# Patient Record
Sex: Female | Born: 1965 | Race: White | Hispanic: No | Marital: Married | Smoking: Never smoker
Health system: Southern US, Community
[De-identification: ages and names within clinical notes are randomized; demographics above are authoritative.]

---

## 1998-04-20 ENCOUNTER — Inpatient Hospital Stay (HOSPITAL_COMMUNITY): Admission: AD | Admit: 1998-04-20 | Discharge: 1998-04-20 | Payer: Self-pay | Admitting: Obstetrics and Gynecology

## 1998-04-21 ENCOUNTER — Inpatient Hospital Stay (HOSPITAL_COMMUNITY): Admission: AD | Admit: 1998-04-21 | Discharge: 1998-04-21 | Payer: Self-pay | Admitting: Obstetrics and Gynecology

## 1998-04-24 ENCOUNTER — Observation Stay (HOSPITAL_COMMUNITY): Admission: AD | Admit: 1998-04-24 | Discharge: 1998-04-25 | Payer: Self-pay | Admitting: Obstetrics and Gynecology

## 1998-05-28 ENCOUNTER — Inpatient Hospital Stay (HOSPITAL_COMMUNITY): Admission: AD | Admit: 1998-05-28 | Discharge: 1998-05-31 | Payer: Self-pay | Admitting: Obstetrics and Gynecology

## 1998-05-30 ENCOUNTER — Encounter: Payer: Self-pay | Admitting: Obstetrics and Gynecology

## 1998-06-01 ENCOUNTER — Inpatient Hospital Stay (HOSPITAL_COMMUNITY): Admission: AD | Admit: 1998-06-01 | Discharge: 1998-06-05 | Payer: Self-pay | Admitting: Obstetrics and Gynecology

## 1998-06-01 ENCOUNTER — Encounter: Payer: Self-pay | Admitting: Obstetrics and Gynecology

## 1998-06-03 ENCOUNTER — Encounter: Payer: Self-pay | Admitting: Obstetrics and Gynecology

## 1998-06-05 ENCOUNTER — Encounter (HOSPITAL_COMMUNITY): Admission: RE | Admit: 1998-06-05 | Discharge: 1998-09-03 | Payer: Self-pay | Admitting: Obstetrics and Gynecology

## 1999-08-20 ENCOUNTER — Other Ambulatory Visit: Admission: RE | Admit: 1999-08-20 | Discharge: 1999-08-20 | Payer: Self-pay | Admitting: Gynecology

## 2002-04-19 ENCOUNTER — Other Ambulatory Visit: Admission: RE | Admit: 2002-04-19 | Discharge: 2002-04-19 | Payer: Self-pay | Admitting: Gynecology

## 2004-01-23 ENCOUNTER — Inpatient Hospital Stay (HOSPITAL_COMMUNITY): Admission: EM | Admit: 2004-01-23 | Discharge: 2004-01-27 | Payer: Self-pay | Admitting: Orthopedic Surgery

## 2004-01-23 ENCOUNTER — Encounter (INDEPENDENT_AMBULATORY_CARE_PROVIDER_SITE_OTHER): Payer: Self-pay | Admitting: Specialist

## 2007-02-26 ENCOUNTER — Emergency Department (HOSPITAL_COMMUNITY): Admission: EM | Admit: 2007-02-26 | Discharge: 2007-02-26 | Payer: Self-pay | Admitting: Emergency Medicine

## 2009-07-11 ENCOUNTER — Emergency Department (HOSPITAL_COMMUNITY): Admission: EM | Admit: 2009-07-11 | Discharge: 2009-07-11 | Payer: Self-pay | Admitting: Emergency Medicine

## 2010-11-23 NOTE — Op Note (Signed)
NAME:  Nancy Chaney, CHIASSON                     ACCOUNT NO.:  [id number]   MEDICAL RECORD NO.:  [id number]                   PATIENT TYPE:  INP   LOCATION:  0472                                 FACILITY:  [facility]   PHYSICIAN:  Georges Lynch. Darrelyn Hillock, M.D.             DATE OF BIRTH:  1965-09-11   DATE OF PROCEDURE:  01/23/2004  DATE OF DISCHARGE:                                 OPERATIVE REPORT   SURGEON:  Georges Lynch. Darrelyn Hillock, M.D.   ASSISTANT:  Ebbie Ridge. Paitsel, P.A.   PREOPERATIVE DIAGNOSIS:  Basilar neck fracture of the left hip.   POSTOPERATIVE DIAGNOSIS:  Basilar neck fracture of the left hip.   OPERATION:  Open reduction and internal fixation of a basilar neck fracture  of the left hip utilizing an 80 mm length compression screw with a four-hole  Barrow plate device with four screws, and it was a 135-degree angle Barrow  plate device.   PROCEDURE:  Under general anesthesia, the patient on the fracture table, I  first did a closed reduction.  The C-arm was brought in.  We did a sterile  prepping and draping of the left hip.  At this time, a lateral incision was  made over the left lateral aspect of the left hip.  Bleeders were identified  and cauterized.  The incision was carried down to the lateral femoral  cortex.  Retractors were inserted.  Bleeders were identified and cauterized.  I then made a drill hole in the lateral femoral cortex and inserted a guide  pin at a 135-degree angle.  We checked the guide pin out on the AP and  lateral.  We had an anatomical position.  Following this, we then measured  the guide pin to be 80 mm in length and then went on and made our initial  drill hole with our cortical reamer.  We tapped the femoral neck and then  inserted our Barrow plate and compression screw device all in one.  We then  affixed the plate with four screws to the lateral femoral shaft.  X-rays, AP  and lateral, showed excellent position of the Barrow plate device and the  compression screw.  We thoroughly irrigated the area out.  Note, we did  release the traction before we did the compression.  We utilized a small  compression screw to further compress the fracture.  We then removed the  small compression screw.  We thoroughly irrigated out the area, had good  hemostasis.  We inserted some Thrombin-soaked Gelfoam and closed the wound  in layers in the usual fashion.  Skin was closed with metal staples.  A  sterile NeoSporin dressing was applied.  The patient left the operating room  in satisfactory condition.  She had 1 gram of IV Ancef preoperatively.  She  will be maintained on Ancef postoperatively as well as Coumadin and heparin  protocol.  Ronald A. Darrelyn Hillock, M.D.    RAG/MEDQ  D:  01/23/2004  T:  01/24/2004  Job:  [id number]

## 2010-11-23 NOTE — H&P (Signed)
NAME:  Nancy Chaney, Nancy Chaney                     ACCOUNT NO.:  [id number]   MEDICAL RECORD NO.:  [id number]                   PATIENT TYPE:  INP   LOCATION:  0472                                 FACILITY:  [facility]   PHYSICIAN:  Georges Lynch. Darrelyn Hillock, M.D.             DATE OF BIRTH:  1966/05/14   DATE OF ADMISSION:  01/23/2004  DATE OF DISCHARGE:                                HISTORY & PHYSICAL   HISTORY:  Left hip pain.  The patient was seen in the office this afternoon  complaining of left hip pain.  She was seen in the office last week for  strain of her left hip flexor.  The patient was working out at [employer], she  was kneeling down at her locker when she lost her balance and fell back.  She felt a crack in her hip and at that time it made it very difficult for  her to weightbear and she was brought into our office.  She was found to  have a fracture of her left hip basilar neck and was admitted to the  hospital for ORIF, left hip.   PAST MEDICAL HISTORY:  Negative.   DRUG ALLERGIES:  NONE KNOWN.   CURRENT MEDICATIONS:  Celebrex 200 mg daily.   REVIEW OF SYSTEMS:  GENERAL:  She denies weight change, fever, chills or  fatigue.  HEENT:  Denies headaches, visual changes or sore throat.  CARDIOVASCULAR:  Denies chest pain, palpitations, shortness of breath or  orthopnea.  PULMONARY:  Denies dyspnea, wheezing, cough sputum production or  hemoptysis.  GI:  Denies dysphagia, nausea, vomiting, hematemesis or  abdominal pain.  GU:  Denies dysuria, frequency, urgency or hematuria.  ENDOCRINE:  Denies polyuria, polydipsia, appetite change, or cold  intolerance.  MUSCULOSKELETAL:  Left hip pain.  NEUROLOGIC:  Denies  dizziness, photophobia, syncope or seizures.  SKIN:  Denies itching, rashes,  masses or moles.   PHYSICAL EXAMINATION:  VITAL SIGNS:  Blood pressure 106/71, respirations 18,  pulse 73, temperature 97.1.  GENERAL:  A 45 year old female in no acute distress.  HEENT:  PERRL, EOMs  intact.  Pharynx is clear.  TMs intact.  NECK:  Supple, without masses.  CHEST:  Clear to auscultation bilaterally, no wheezes, rales or rhonchi  noted.  HEART:  Regular rate and rhythm, without murmur.  ABDOMEN:  Positive bowel sounds, soft, nontender, no organomegaly or  abnormal masses.  EXTREMITIES:  Painful range of motion, left hip.  SKIN:  Intact.   ASSESSMENT:  Basilar neck fracture, left hip.   PLAN:  The patient was admitted to [facility] for open reduction-  internal fixation, left hip.     Ebbie Ridge. Paitsel, P.A.                     Ronald A. Darrelyn Hillock, M.D.    Tilden Dome  D:  01/23/2004  T:  01/24/2004  Job:  [id number]

## 2010-11-23 NOTE — Discharge Summary (Signed)
NAME:  Nancy Chaney, Nancy Chaney                     ACCOUNT NO.:  [id number]   MEDICAL RECORD NO.:  [id number]                   PATIENT TYPE:  INP   LOCATION:  0472                                 [facility]ACILITY:  [facility]   PHYSICIAN:  Georges Lynch. Nancy Chaney, M.D.             DATE OF BIRTH:  Aug 30, 1965   DATE OF ADMISSION:  01/23/2004  DATE OF DISCHARGE:  01/27/2004                                 DISCHARGE SUMMARY   ADMISSION DIAGNOSIS:  Left hip fracture.   DISCHARGE DIAGNOSIS:  Open reduction and internal fixation left hip.   PROCEDURES:  The patient was taken to the operating room on January 23, 2004 to  undergo ORIF left hip for a basilar neck fracture.  Surgeon Dr. Ranee Gosselin, assistant Ebbie Ridge. Paitsel, P.A.-C.  Surgery was performed under  general anesthesia.   CONSULTATIONS:  Physical therapy.   BRIEF HISTORY:  This is a 45 year old female who was seen in the office  complaining of left hip pain.  She denied specific injury.  The patient was  working out in [employer]  She was kneeling down at her locker when she lost  her balance and fell back.  She felt a crack in her left hip and at that  time it made it very difficult for her to bear weight and she was seen in  our office for evaluation.  She was found to have a fracture of her left  hip, basilar neck, and was admitted to the hospital for ORIF left hip.   Admission CBC:  WBC 9, RBC 4.18, hemoglobin 13.1, hematocrit 38.8, platelet  count 274.  Admission PT 11.9, INR 0.8, PTT 25.  Admission chemistries:  Sodium 136, potassium 4.3, chloride 101, CO2 27, glucose 90, BUN 19,  creatinine 0.7, calcium 8.8, total protein 6.9, albumin 3.7.  Admission  urinalysis revealed trace ketones and positive nitrites.  Microscopic exam  revealed rare epithelial cells, 3-6 WBC's per high-powered field, many  bacteria.   The patient's admission EKG:  Normal sinus rhythm at a rate of 72.  Admission chest x-ray:  No active disease.  Preoperative x-ray of  left hip  revealed a left basilar neck fracture.  Postoperative x-ray of left hip  revealed dynamic compression screw left hip.   HOSPITAL COURSE:  The patient was admitted to [facility].  She was  taken to the operating room.  She underwent the above-stated procedure  without complications.  She tolerated the procedure well and was allowed to  return to the recovery room, then the orthopedic floor to continue her  postoperative care.  The patient was placed on PC analgesia for pain  control.  The patient's pain was well controlled and she was weaned over to  oral analgesics.  PCA was discontinued on postoperative day #2.  PT was  consulted for gait training ambulation.  The patient was able to ambulate  touchdown weightbearing with a walker.  She had  bone biopsy at the time of  surgery to rule out pathologic fracture and that came back negative.  The  family was concerned about the patient's nutritional habits, so a  nutritionist was consulted while she was in the hospital.  The patient's  hemoglobin and hematocrit were followed throughout her hospitalization.  She  had a slight postoperative drop in her hemoglobin to 10.9 but that  stabilized prior to discharge.  The patient progressed well and was allowed  to be discharged home on postoperative day #4.   DISCHARGE DATE:  January 27, 2004   DISCHARGE MEDICATIONS:  1. Percocet 1 or 2 every six hours as needed for pain.  2. Robaxin 500 mg 1 every six hours as needed for muscle spasm.  3. Coumadin 6 mg daily.   ACTIVITY:  Fifty percent weightbearing with crutches.   WOUND CARE:  Daily dressing changes.  Keep the wound dry and clean.   SPECIAL INSTRUCTIONS:  [facility] will monitor PT/INR.   FOLLOW UP:  The patient will follow up with Dr. Darrelyn Chaney in the office on  August 1.  She will call the office to schedule the appointment.   CONDITION AT THE TIME OF DISCHARGE:  Stable.     Ebbie Ridge. Paitsel, P.A.                      Ronald A. Nancy Chaney, M.D.    Tilden Dome  D:  02/14/2004  T:  02/14/2004  Job:  [id number]

## 2011-04-19 LAB — DIFFERENTIAL
Basophils Absolute: 0
Basophils Relative: 0
Eosinophils Absolute: 0.1
Monocytes Relative: 4
Neutro Abs: 8.8 — ABNORMAL HIGH
Neutrophils Relative %: 83 — ABNORMAL HIGH

## 2011-04-19 LAB — URINALYSIS, ROUTINE W REFLEX MICROSCOPIC
Glucose, UA: NEGATIVE
Leukocytes, UA: NEGATIVE
Specific Gravity, Urine: 1.024
Urobilinogen, UA: 0.2

## 2011-04-19 LAB — BASIC METABOLIC PANEL
BUN: 32 — ABNORMAL HIGH
CO2: 28
Calcium: 9.1
Creatinine, Ser: 0.8
GFR calc Af Amer: >60

## 2011-04-19 LAB — CBC
MCHC: 33.7
MCV: 93.1
Platelets: 277
RBC: 3.74 — ABNORMAL LOW
RDW: 13.8

## 2011-04-19 LAB — URINE MICROSCOPIC-ADD ON

## 2015-04-20 ENCOUNTER — Emergency Department (HOSPITAL_BASED_OUTPATIENT_CLINIC_OR_DEPARTMENT_OTHER)
Admission: EM | Admit: 2015-04-20 | Discharge: 2015-04-20 | Disposition: A | Discharge status: Home / Self Care | Payer: BLUE CROSS/BLUE SHIELD | Attending: Emergency Medicine | Admitting: Emergency Medicine

## 2015-04-20 ENCOUNTER — Encounter (HOSPITAL_BASED_OUTPATIENT_CLINIC_OR_DEPARTMENT_OTHER): Payer: Self-pay | Admitting: Adult Health

## 2015-04-20 ENCOUNTER — Emergency Department (HOSPITAL_BASED_OUTPATIENT_CLINIC_OR_DEPARTMENT_OTHER): Payer: BLUE CROSS/BLUE SHIELD

## 2015-04-20 DIAGNOSIS — Y998 Other external cause status: Secondary | ICD-10-CM | POA: Insufficient documentation

## 2015-04-20 DIAGNOSIS — Y9389 Activity, other specified: Secondary | ICD-10-CM | POA: Insufficient documentation

## 2015-04-20 DIAGNOSIS — S60511A Abrasion of right hand, initial encounter: Secondary | ICD-10-CM | POA: Diagnosis not present

## 2015-04-20 DIAGNOSIS — Y92410 Unspecified street and highway as the place of occurrence of the external cause: Secondary | ICD-10-CM | POA: Diagnosis not present

## 2015-04-20 DIAGNOSIS — Z23 Encounter for immunization: Secondary | ICD-10-CM | POA: Diagnosis not present

## 2015-04-20 DIAGNOSIS — S6991XA Unspecified injury of right wrist, hand and finger(s), initial encounter: Secondary | ICD-10-CM | POA: Diagnosis present

## 2015-04-20 DIAGNOSIS — S60222A Contusion of left hand, initial encounter: Secondary | ICD-10-CM | POA: Insufficient documentation

## 2015-04-20 DIAGNOSIS — S60311A Abrasion of right thumb, initial encounter: Secondary | ICD-10-CM | POA: Diagnosis not present

## 2015-04-20 MED ORDER — TETANUS-DIPHTH-ACELL PERTUSSIS 5-2.5-18.5 LF-MCG/0.5 IM SUSP: 0.5000 mL | Freq: Once | INTRAMUSCULAR | Status: DC

## 2015-04-20 MED ORDER — TETANUS-DIPHTH-ACELL PERTUSSIS 5-2.5-18.5 LF-MCG/0.5 IM SUSP
0.5000 mL | Freq: Once | INTRAMUSCULAR | Status: AC
  Administered 2015-04-20: 0.5 mL via INTRAMUSCULAR
  Filled 2015-04-20: qty 0.5

## 2015-04-20 NOTE — ED Notes (Signed)
I completed wound care with non-adherent pad and bacitracin held with medium kerlix. I gave patient additional supplies for home care.
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
## 2015-04-20 NOTE — Discharge Instructions (Signed)
Abrasion An abrasion is a cut or scrape on the outer surface of your skin. An abrasion does not extend through all of the layers of your skin. It is important to care for your abrasion properly to prevent infection. CAUSES Most abrasions are caused by falling on or gliding across the ground or another surface. When your skin rubs on something, the outer and inner layer of skin rubs off.  SYMPTOMS A cut or scrape is the main symptom of this condition. The scrape may be bleeding, or it may appear red or pink. If there was an associated fall, there may be an underlying bruise. DIAGNOSIS An abrasion is diagnosed with a physical exam. TREATMENT Treatment for this condition depends on how large and deep the abrasion is. Usually, your abrasion will be cleaned with water and mild soap. This removes any dirt or debris that may be stuck. An antibiotic ointment may be applied to the abrasion to help prevent infection. A bandage (dressing) may be placed on the abrasion to keep it clean. You may also need a tetanus shot. HOME CARE INSTRUCTIONS Medicines  Take or apply medicines only as directed by your health care provider.  If you were prescribed an antibiotic ointment, finish all of it even if you start to feel better. Wound Care  Clean the wound with mild soap and water 2-3 times per day or as directed by your health care provider. Pat your wound dry with a clean towel. Do not rub it.  There are many different ways to close and cover a wound. Follow instructions from your health care provider about:  Wound care.  Dressing changes and removal.  Check your wound every day for signs of infection. Watch for:  Redness, swelling, or pain.  Fluid, blood, or pus. General Instructions  Keep the dressing dry as directed by your health care provider. Do not take baths, swim, use a hot tub, or do anything that would put your wound underwater until your health care provider approves.  If there is  swelling, raise (elevate) the injured area above the level of your heart while you are sitting or lying down.  Keep all follow-up visits as directed by your health care provider. This is important. SEEK MEDICAL CARE IF:  You received a tetanus shot and you have swelling, severe pain, redness, or bleeding at the injection site.  Your pain is not controlled with medicine.  You have increased redness, swelling, or pain at the site of your wound. SEEK IMMEDIATE MEDICAL CARE IF:  You have a red streak going away from your wound.  You have a fever.  You have fluid, blood, or pus coming from your wound.  You notice a bad smell coming from your wound or your dressing.   This information is not intended to replace advice given to you by your health care provider. Make sure you discuss any questions you have with your health care provider.   Document Released: 04/03/2005 Document Revised: 03/15/2015 Document Reviewed: 06/22/2014 Elsevier Interactive Patient Education 2016 [employer] Inc[employer]  Contusion A contusion is a deep bruise. Contusions happen when an injury causes bleeding under the skin. Symptoms of bruising include pain, swelling, and discolored skin. The skin may turn blue, purple, or yellow. HOME CARE   Rest the injured area.  If told, put ice on the injured area.  Put ice in a plastic bag.  Place a towel between your skin and the bag.  Leave the ice on for 20 minutes, 2-3  times per day.  If told, put light pressure (compression) on the injured area using an elastic bandage. Make sure the bandage is not too tight. Remove it and put it back on as told by your doctor.  If possible, raise (elevate) the injured area above the level of your heart while you are sitting or lying down.  Take over-the-counter and prescription medicines only as told by your doctor. GET HELP IF:  Your symptoms do not get better after several days of treatment.  Your symptoms get worse.  You have  trouble moving the injured area. GET HELP RIGHT AWAY IF:   You have very bad pain.  You have a loss of feeling (numbness) in a hand or foot.  Your hand or foot turns pale or cold.   This information is not intended to replace advice given to you by your health care provider. Make sure you discuss any questions you have with your health care provider.   Document Released: 12/11/2007 Document Revised: 03/15/2015 Document Reviewed: 11/09/2014 Elsevier Interactive Patient Education 2016 [employer] Tourist information centre managerMotor Vehicle Collision It is common to have multiple bruises and sore muscles after a motor vehicle collision (MVC). These tend to feel worse for the first 24 hours. You may have the most stiffness and soreness over the first several hours. You may also feel worse when you wake up the first morning after your collision. After this point, you will usually begin to improve with each day. The speed of improvement often depends on the severity of the collision, the number of injuries, and the location and nature of these injuries. HOME CARE INSTRUCTIONS  Put ice on the injured area.  Put ice in a plastic bag.  Place a towel between your skin and the bag.  Leave the ice on for 15-20 minutes, 3-4 times a day, or as directed by your health care provider.  Drink enough fluids to keep your urine clear or pale yellow. Do not drink alcohol.  Take a warm shower or bath once or twice a day. This will increase blood flow to sore muscles.  You may return to activities as directed by your caregiver. Be careful when lifting, as this may aggravate neck or back pain.  Only take over-the-counter or prescription medicines for pain, discomfort, or fever as directed by your caregiver. Do not use aspirin. This may increase bruising and bleeding. SEEK IMMEDIATE MEDICAL CARE IF:  You have numbness, tingling, or weakness in the arms or legs.  You develop severe headaches not relieved with medicine.  You have  severe neck pain, especially tenderness in the middle of the back of your neck.  You have changes in bowel or bladder control.  There is increasing pain in any area of the body.  You have shortness of breath, light-headedness, dizziness, or fainting.  You have chest pain.  You feel sick to your stomach (nauseous), throw up (vomit), or sweat.  You have increasing abdominal discomfort.  There is blood in your urine, stool, or vomit.  You have pain in your shoulder (shoulder strap areas).  You feel your symptoms are getting worse. MAKE SURE YOU:  Understand these instructions.  Will watch your condition.  Will get help right away if you are not doing well or get worse.   This information is not intended to replace advice given to you by your health care provider. Make sure you discuss any questions you have with your health care provider.   Document Released: 06/24/2005 Document Revised:  07/15/2014 Document Reviewed: 11/21/2010 Elsevier Interactive Patient Education [employer]

## 2015-04-20 NOTE — ED Notes (Signed)
NP at bedside.
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
## 2019-10-25 DIAGNOSIS — F411 Generalized anxiety disorder: Secondary | ICD-10-CM | POA: Diagnosis not present

## 2019-10-25 DIAGNOSIS — F321 Major depressive disorder, single episode, moderate: Secondary | ICD-10-CM | POA: Diagnosis not present

## 2020-01-20 DIAGNOSIS — F321 Major depressive disorder, single episode, moderate: Secondary | ICD-10-CM | POA: Diagnosis not present

## 2020-01-20 DIAGNOSIS — F411 Generalized anxiety disorder: Secondary | ICD-10-CM | POA: Diagnosis not present

## 2020-04-13 DIAGNOSIS — F321 Major depressive disorder, single episode, moderate: Secondary | ICD-10-CM | POA: Diagnosis not present

## 2020-04-13 DIAGNOSIS — F411 Generalized anxiety disorder: Secondary | ICD-10-CM | POA: Diagnosis not present

## 2020-07-14 DIAGNOSIS — M25531 Pain in right wrist: Secondary | ICD-10-CM | POA: Diagnosis not present

## 2020-07-14 DIAGNOSIS — G5601 Carpal tunnel syndrome, right upper limb: Secondary | ICD-10-CM | POA: Diagnosis not present

## 2020-07-17 DIAGNOSIS — F411 Generalized anxiety disorder: Secondary | ICD-10-CM | POA: Diagnosis not present

## 2020-07-17 DIAGNOSIS — F321 Major depressive disorder, single episode, moderate: Secondary | ICD-10-CM | POA: Diagnosis not present

## 2020-10-16 DIAGNOSIS — F321 Major depressive disorder, single episode, moderate: Secondary | ICD-10-CM | POA: Diagnosis not present

## 2020-10-16 DIAGNOSIS — F411 Generalized anxiety disorder: Secondary | ICD-10-CM | POA: Diagnosis not present

## 2021-01-03 DIAGNOSIS — F321 Major depressive disorder, single episode, moderate: Secondary | ICD-10-CM | POA: Diagnosis not present

## 2021-01-03 DIAGNOSIS — F411 Generalized anxiety disorder: Secondary | ICD-10-CM | POA: Diagnosis not present

## 2021-03-30 DIAGNOSIS — F900 Attention-deficit hyperactivity disorder, predominantly inattentive type: Secondary | ICD-10-CM | POA: Diagnosis not present

## 2021-06-18 DIAGNOSIS — F900 Attention-deficit hyperactivity disorder, predominantly inattentive type: Secondary | ICD-10-CM | POA: Diagnosis not present

## 2021-06-18 DIAGNOSIS — M25552 Pain in left hip: Secondary | ICD-10-CM | POA: Diagnosis not present

## 2021-06-18 DIAGNOSIS — M1612 Unilateral primary osteoarthritis, left hip: Secondary | ICD-10-CM | POA: Diagnosis not present

## 2021-07-19 ENCOUNTER — Other Ambulatory Visit: Payer: Self-pay | Admitting: Orthopedic Surgery

## 2021-07-19 DIAGNOSIS — M1612 Unilateral primary osteoarthritis, left hip: Secondary | ICD-10-CM | POA: Diagnosis not present

## 2021-07-19 DIAGNOSIS — M25552 Pain in left hip: Secondary | ICD-10-CM

## 2021-08-10 DIAGNOSIS — Z131 Encounter for screening for diabetes mellitus: Secondary | ICD-10-CM | POA: Diagnosis not present

## 2021-08-10 DIAGNOSIS — Z0000 Encounter for general adult medical examination without abnormal findings: Secondary | ICD-10-CM | POA: Diagnosis not present

## 2021-08-10 DIAGNOSIS — Z13220 Encounter for screening for lipoid disorders: Secondary | ICD-10-CM | POA: Diagnosis not present

## 2021-08-10 DIAGNOSIS — Z01818 Encounter for other preprocedural examination: Secondary | ICD-10-CM | POA: Diagnosis not present

## 2021-08-13 ENCOUNTER — Ambulatory Visit
Admission: RE | Admit: 2021-08-13 | Discharge: 2021-08-13 | Disposition: A | Discharge status: Home / Self Care | Payer: BC Managed Care – PPO | Source: Ambulatory Visit | Attending: Orthopedic Surgery | Admitting: Orthopedic Surgery

## 2021-08-13 ENCOUNTER — Other Ambulatory Visit: Payer: Self-pay

## 2021-08-13 DIAGNOSIS — M6281 Muscle weakness (generalized): Secondary | ICD-10-CM | POA: Diagnosis not present

## 2021-08-13 DIAGNOSIS — M1612 Unilateral primary osteoarthritis, left hip: Secondary | ICD-10-CM | POA: Diagnosis not present

## 2021-08-13 DIAGNOSIS — Z01818 Encounter for other preprocedural examination: Secondary | ICD-10-CM | POA: Diagnosis not present

## 2021-08-13 DIAGNOSIS — M25552 Pain in left hip: Secondary | ICD-10-CM | POA: Diagnosis not present

## 2021-08-13 DIAGNOSIS — M25652 Stiffness of left hip, not elsewhere classified: Secondary | ICD-10-CM | POA: Diagnosis not present

## 2021-08-13 DIAGNOSIS — Z9889 Other specified postprocedural states: Secondary | ICD-10-CM | POA: Diagnosis not present

## 2021-08-13 DIAGNOSIS — S72302A Unspecified fracture of shaft of left femur, initial encounter for closed fracture: Secondary | ICD-10-CM | POA: Diagnosis not present

## 2021-08-15 DIAGNOSIS — M1612 Unilateral primary osteoarthritis, left hip: Secondary | ICD-10-CM | POA: Diagnosis not present

## 2021-08-24 DIAGNOSIS — M1612 Unilateral primary osteoarthritis, left hip: Secondary | ICD-10-CM | POA: Diagnosis not present

## 2021-08-24 DIAGNOSIS — Z472 Encounter for removal of internal fixation device: Secondary | ICD-10-CM | POA: Diagnosis not present

## 2021-09-14 DIAGNOSIS — F900 Attention-deficit hyperactivity disorder, predominantly inattentive type: Secondary | ICD-10-CM | POA: Diagnosis not present

## 2021-10-01 DIAGNOSIS — F900 Attention-deficit hyperactivity disorder, predominantly inattentive type: Secondary | ICD-10-CM | POA: Diagnosis not present

## 2021-12-17 DIAGNOSIS — F900 Attention-deficit hyperactivity disorder, predominantly inattentive type: Secondary | ICD-10-CM | POA: Diagnosis not present

## 2022-03-01 DIAGNOSIS — M792 Neuralgia and neuritis, unspecified: Secondary | ICD-10-CM | POA: Diagnosis not present

## 2022-03-01 DIAGNOSIS — M2011 Hallux valgus (acquired), right foot: Secondary | ICD-10-CM | POA: Diagnosis not present

## 2022-03-14 DIAGNOSIS — Z96642 Presence of left artificial hip joint: Secondary | ICD-10-CM | POA: Diagnosis not present

## 2022-03-15 DIAGNOSIS — M792 Neuralgia and neuritis, unspecified: Secondary | ICD-10-CM | POA: Diagnosis not present

## 2022-03-18 DIAGNOSIS — F900 Attention-deficit hyperactivity disorder, predominantly inattentive type: Secondary | ICD-10-CM | POA: Diagnosis not present

## 2022-06-17 DIAGNOSIS — F900 Attention-deficit hyperactivity disorder, predominantly inattentive type: Secondary | ICD-10-CM | POA: Diagnosis not present

## 2022-09-16 DIAGNOSIS — F900 Attention-deficit hyperactivity disorder, predominantly inattentive type: Secondary | ICD-10-CM | POA: Diagnosis not present

## 2022-12-16 DIAGNOSIS — F900 Attention-deficit hyperactivity disorder, predominantly inattentive type: Secondary | ICD-10-CM | POA: Diagnosis not present

## 2023-03-14 DIAGNOSIS — F900 Attention-deficit hyperactivity disorder, predominantly inattentive type: Secondary | ICD-10-CM | POA: Diagnosis not present

## 2023-06-12 DIAGNOSIS — F900 Attention-deficit hyperactivity disorder, predominantly inattentive type: Secondary | ICD-10-CM | POA: Diagnosis not present

## 2023-09-17 IMAGING — CT CT HIP*L* W/O CM
1 series · 14 of 16 positions shown, 18 images · non-contrast
Comparison: 02/26/2007

CLINICAL DATA: Preoperative exam for left hip arthroplasty.
Evaluate hardware

EXAM:
CT OF THE LEFT HIP WITHOUT CONTRAST
TECHNIQUE: Multidetector CT imaging of the left hip was performed according to
the standard protocol. Multiplanar CT image reconstructions were
also generated.
RADIATION DOSE REDUCTION: This exam was performed according to the
departmental dose-optimization program which includes automated
exposure control, adjustment of the mA and/or kV according to
patient size and/or use of iterative reconstruction technique.

[Series 4: soft tissue lower extremity (person_name) · axial · 0.40mm/px · z∈[+460,+754]mm · 14 of 171 slices shown, 18 images]
[im 12/171  soft-tissue]
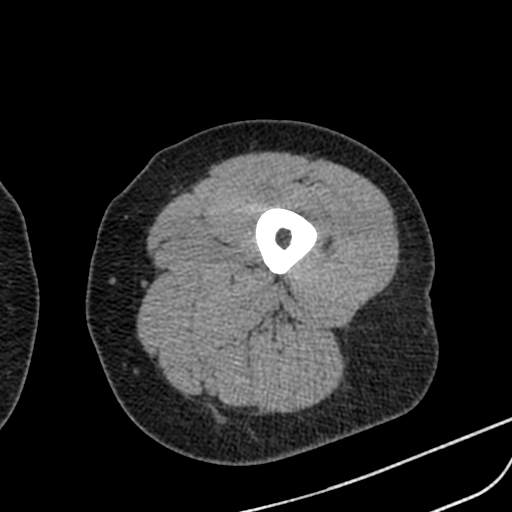
[im 12/171  bone]
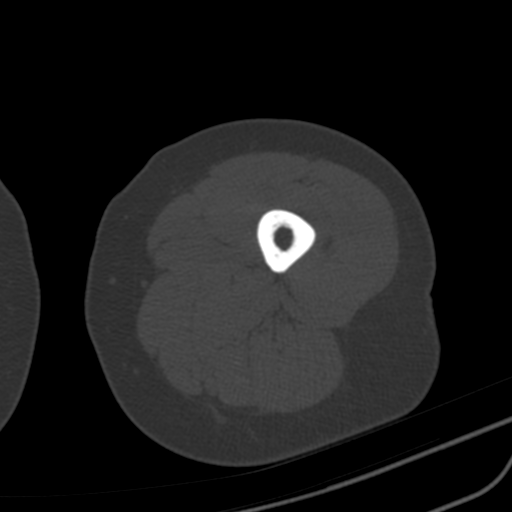
[im 23/171  soft-tissue]
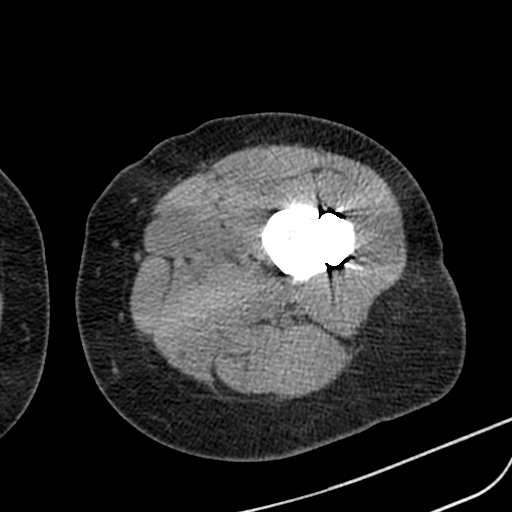
[im 35/171  soft-tissue]
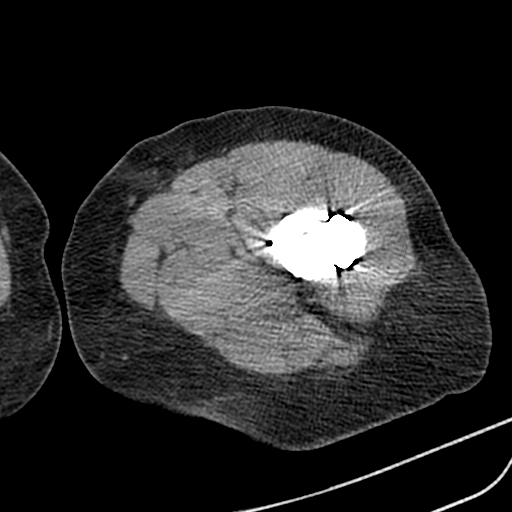
[im 46/171  soft-tissue]
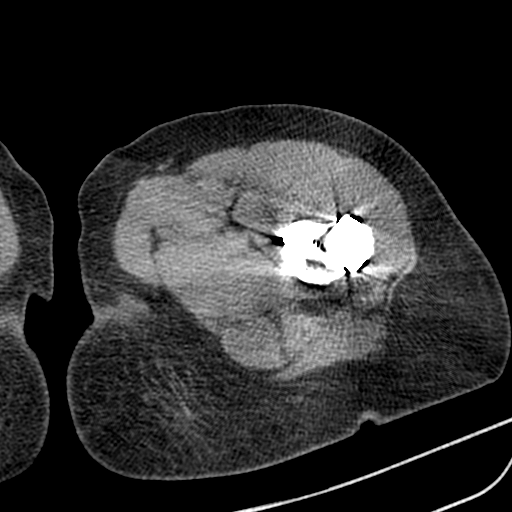
[im 57/171  soft-tissue]
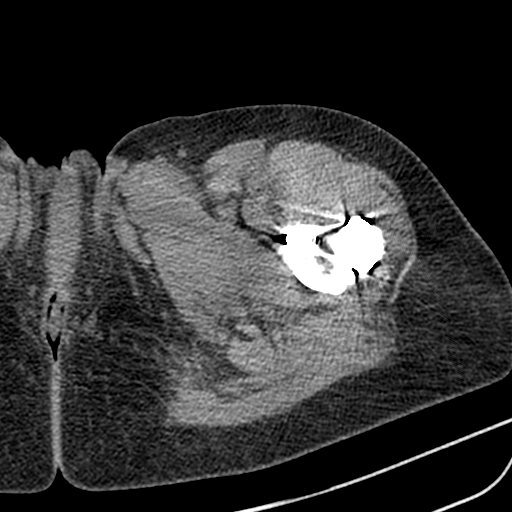
[im 57/171  bone]
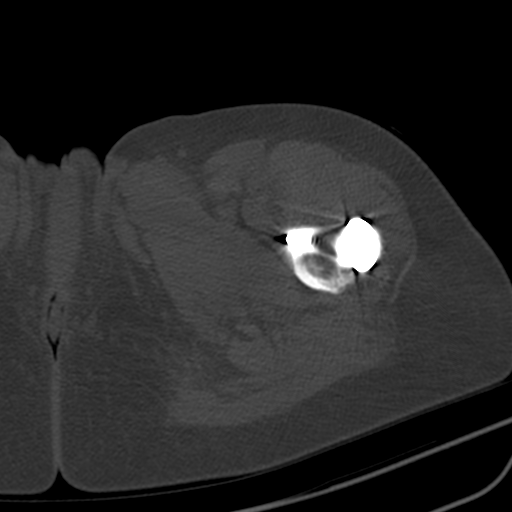
[im 69/171  soft-tissue]
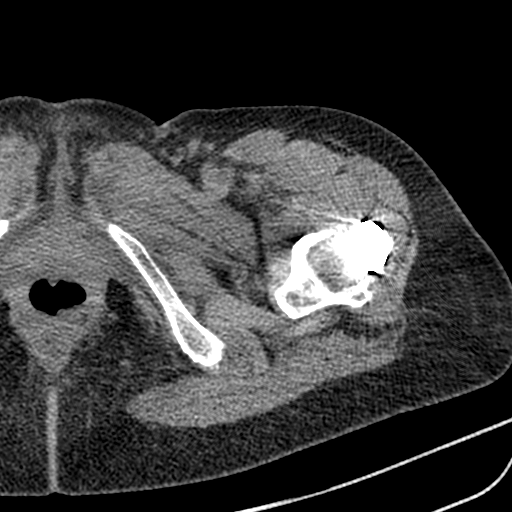
[im 80/171  soft-tissue]
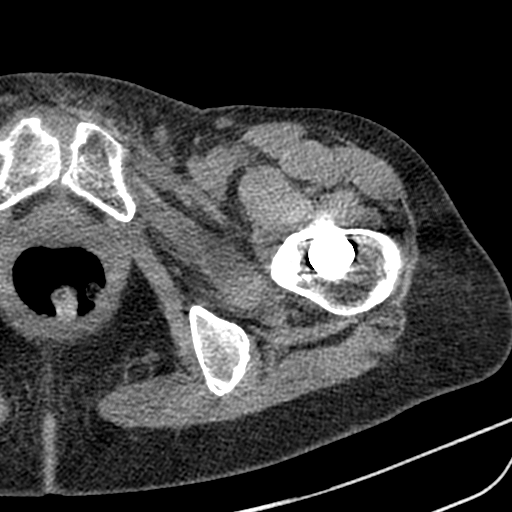
[im 91/171  soft-tissue]
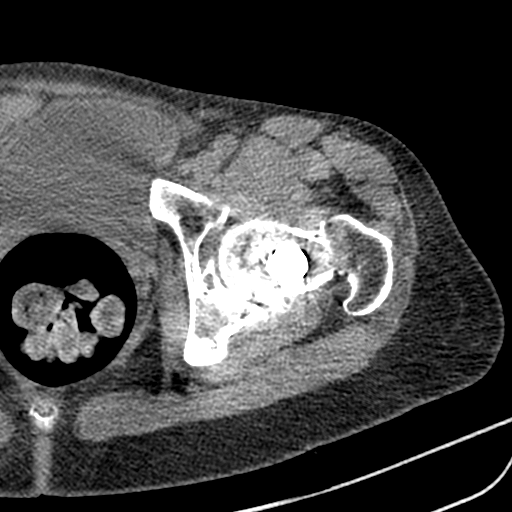
[im 103/171  soft-tissue]
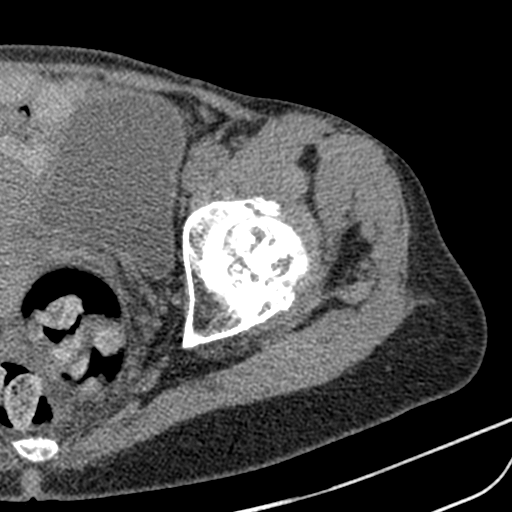
[im 103/171  bone]
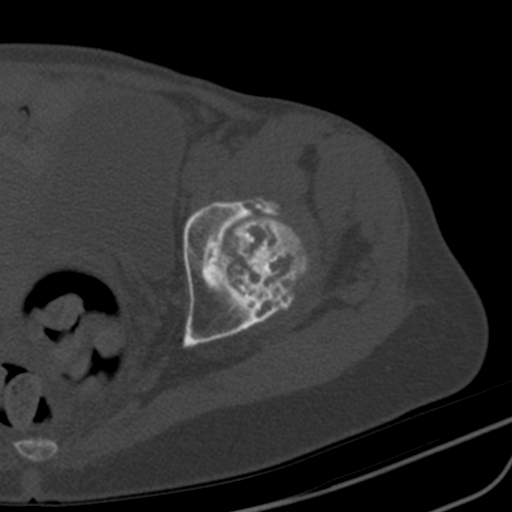
[im 114/171  soft-tissue]
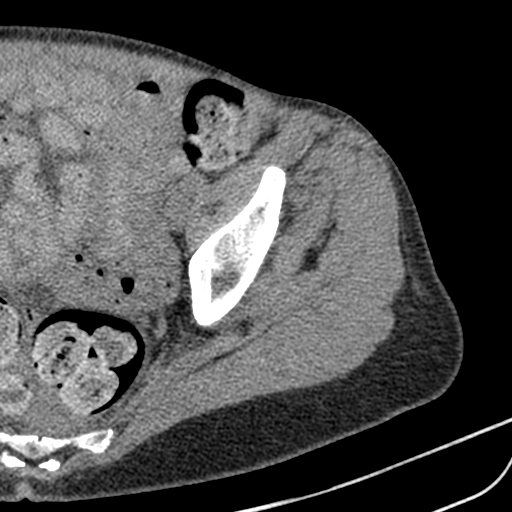
[im 125/171  soft-tissue]
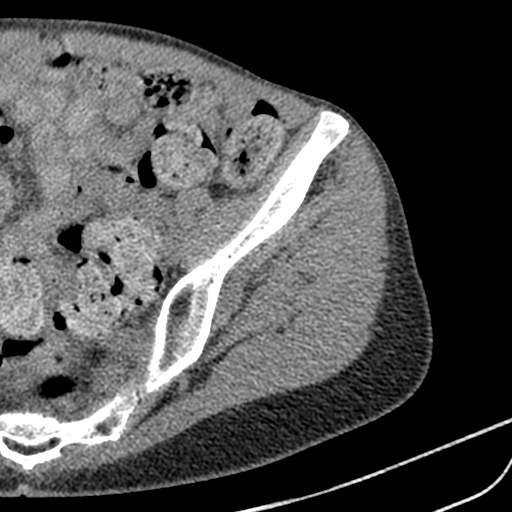
[im 137/171  soft-tissue]
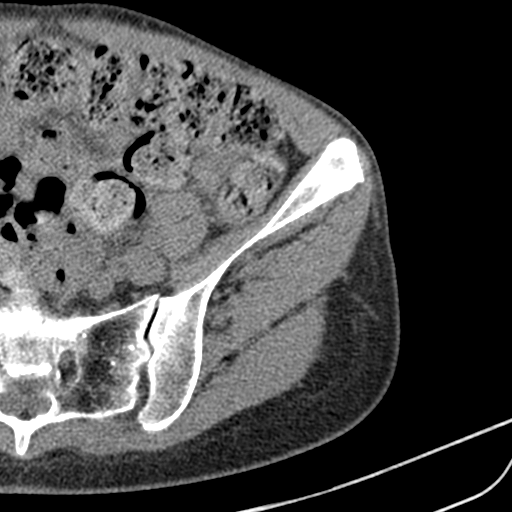
[im 148/171  soft-tissue]
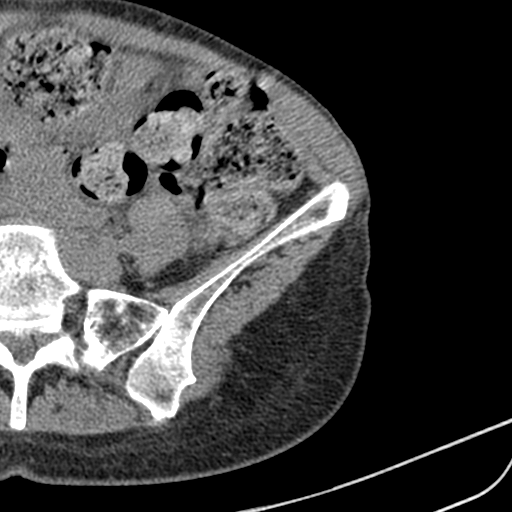
[im 148/171  bone]
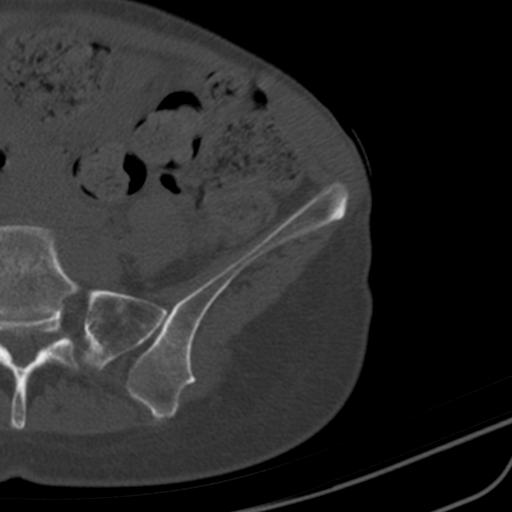
[im 159/171  soft-tissue]
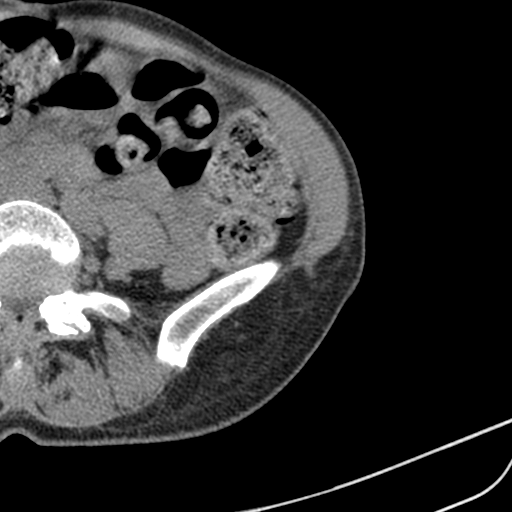

[14 of 16 positions shown; findings below may reference images not displayed]

FINDINGS: Bones/Joint/Cartilage

Postsurgical changes from left hip ORIF with dynamic hip screw. The
2 distal most screws within the femoral shaft are fractured
proximally (series 1, image 1). Remainder of the hardware appears
intact. No periprosthetic lucency or fracture identified. Severe
end-stage degenerative changes of the left hip joint with complete
joint space loss, subchondral sclerosis/cystic change, and marginal
osteophyte formation. Osseous structures appear otherwise within
normal limits.

Ligaments

Suboptimally assessed by CT.

Muscles and Tendons

No acute musculotendinous abnormality by CT.

Soft tissues

No soft tissue edema or fluid collection. No left inguinal
lymphadenopathy. Large amount of stool within the visualized colon.
IMPRESSION: 1. Postsurgical changes from left hip ORIF with dynamic hip screw.
The 2 distal most screws within the femoral shaft are fractured
proximally.
2. Severe end-stage degenerative changes of the left hip joint.
3. Large amount of stool within the visualized colon.

## 2023-10-13 DIAGNOSIS — F900 Attention-deficit hyperactivity disorder, predominantly inattentive type: Secondary | ICD-10-CM | POA: Diagnosis not present

## 2024-01-01 DIAGNOSIS — F900 Attention-deficit hyperactivity disorder, predominantly inattentive type: Secondary | ICD-10-CM | POA: Diagnosis not present

## 2024-03-19 DIAGNOSIS — F900 Attention-deficit hyperactivity disorder, predominantly inattentive type: Secondary | ICD-10-CM | POA: Diagnosis not present

## 2024-06-09 DIAGNOSIS — F900 Attention-deficit hyperactivity disorder, predominantly inattentive type: Secondary | ICD-10-CM | POA: Diagnosis not present
